# Patient Record
Sex: Female | Born: 2000 | Race: White | Hispanic: No | Marital: Single | Smoking: Never smoker
Health system: Southern US, Community
[De-identification: ages and names within clinical notes are randomized; demographics above are authoritative.]

## PROBLEM LIST (undated history)

## (undated) DIAGNOSIS — Z889 Allergy status to unspecified drugs, medicaments and biological substances status: Secondary | ICD-10-CM

## (undated) DIAGNOSIS — S62109A Fracture of unspecified carpal bone, unspecified wrist, initial encounter for closed fracture: Secondary | ICD-10-CM

## (undated) HISTORY — PX: MOUTH SURGERY: SHX715

---
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
## 2020-05-11 ENCOUNTER — Encounter: Payer: Self-pay | Admitting: *Deleted

## 2020-05-11 ENCOUNTER — Emergency Department (INDEPENDENT_AMBULATORY_CARE_PROVIDER_SITE_OTHER): Payer: No Typology Code available for payment source

## 2020-05-11 ENCOUNTER — Other Ambulatory Visit: Payer: Self-pay

## 2020-05-11 ENCOUNTER — Emergency Department
Admission: EM | Admit: 2020-05-11 | Discharge: 2020-05-11 | Disposition: A | Discharge status: Home / Self Care | Payer: No Typology Code available for payment source | Source: Home / Self Care | Attending: Family Medicine | Admitting: Family Medicine

## 2020-05-11 DIAGNOSIS — S86309A Unspecified injury of muscle(s) and tendon(s) of peroneal muscle group at lower leg level, unspecified leg, initial encounter: Secondary | ICD-10-CM | POA: Diagnosis not present

## 2020-05-11 DIAGNOSIS — M79672 Pain in left foot: Secondary | ICD-10-CM

## 2020-05-11 NOTE — Discharge Instructions (Addendum)
Apply ice pack for 30 minutes 3 to 4 times daily until improved. Wear brace for about 2 to 3 weeks.  Begin range of motion and stretching exercises as tolerated. May take Ibuprofen 200mg , 3 or 4 tabs every 8 hours with food.

## 2020-05-11 NOTE — ED Provider Notes (Signed)
[facility]    CSN: [id number] Arrival date & time: 05/11/20  7829      History   Chief Complaint Chief Complaint  Patient presents with  . Foot Pain    HPI Andrea Valentine is a 19 y.o. female.   Patient developed pain in the lateral aspect of her left foot last night.  She had been outside exercising earlier, but recalls no injury.  She fractured her left ankle several years ago.  The history is provided by the patient.  Foot Pain This is a new problem. The current episode started yesterday. The problem occurs constantly. The problem has not changed since onset.The symptoms are aggravated by walking. Nothing relieves the symptoms. Treatments tried: Tylenol. The treatment provided no relief.    Past Medical History:  Diagnosis Date  . H/O seasonal allergies   . Wrist fracture     Patient Active Problem List   Diagnosis Date Noted  . Elbow fracture, left 05/08/2012  . SHOULDER PAIN, LEFT 05/02/2011  . ELBOW PAIN, LEFT 05/02/2011  . WRIST PAIN, LEFT 05/02/2011  . HAND PAIN, LEFT 05/02/2011  . ALLERGIC RHINITIS 11/22/2010    Past Surgical History:  Procedure Laterality Date  . MOUTH SURGERY      OB History   No obstetric history on file.      Home Medications    Prior to Admission medications   Medication Sig Start Date End Date Taking? Authorizing Provider  FLUoxetine (PROZAC) 10 MG capsule Take by mouth. 04/27/20  Yes [provider]  DiphenhydrAMINE HCl (ALLERGY MED PO) Take by mouth.    [provider]  etonogestrel-ethinyl estradiol (NUVARING) 0.12-0.015 MG/24HR vaginal ring Place 1 each vaginally every 28 (twenty-eight) days. Insert vaginally and leave in place for 3 consecutive weeks, then remove for 1 week.    [provider]    Family History Family History  Problem Relation Age of Onset  . Hypertension Mother   . Hyperlipidemia Father     Social History Social History   Tobacco Use  . Smoking status:  Never Smoker  . Smokeless tobacco: Never Used  Vaping Use  . Vaping Use: Never used  Substance Use Topics  . Alcohol use: No  . Drug use: No     Allergies   Patient has no known allergies.   Review of Systems Review of Systems  Constitutional: Negative.   Musculoskeletal: Negative for joint swelling.  Skin: Negative for color change, rash and wound.  All other systems reviewed and are negative.    Physical Exam Triage Vital Signs ED Triage Vitals  Enc Vitals Group     BP 05/11/20 0949 126/76     Pulse Rate 05/11/20 0949 93     Resp 05/11/20 0949 16     Temp 05/11/20 0949 98.7 F (37.1 C)     Temp Source 05/11/20 0949 Oral     SpO2 05/11/20 0949 98 %     Weight 05/11/20 0946 158 lb (71.7 kg)     Height 05/11/20 0946 5\' 6"  (1.676 m)     Head Circumference --      Peak Flow --      Pain Score 05/11/20 0946 6     Pain Loc --      Pain Edu? --      Excl. in GC? --    No data found.  Updated Vital Signs BP 126/76 (BP Location: Right Arm)   Pulse 93   Temp 98.7 F (  37.1 C) (Oral)   Resp 16   Ht 5\' 6"  (1.676 m)   Wt 71.7 kg   LMP 04/20/2020   SpO2 98%   BMI 25.50 kg/m   Visual Acuity Right Eye Distance:   Left Eye Distance:   Bilateral Distance:    Right Eye Near:   Left Eye Near:    Bilateral Near:     Physical Exam Vitals and nursing note reviewed.  Constitutional:      General: She is not in acute distress. HENT:     Head: Normocephalic.  Eyes:     Pupils: Pupils are equal, round, and reactive to light.  Cardiovascular:     Rate and Rhythm: Normal rate.  Pulmonary:     Effort: Pulmonary effort is normal.  Musculoskeletal:       Feet:     Comments: There is tenderness over the course of the left peroneal tendon and base of 5th metatarsal.  Pain is elicited with resisted eversion and resisted plantar flexion of the ankle.  Distal neurovascular function is intact.    Skin:    General: Skin is warm and dry.  Neurological:     Mental  Status: She is alert.      UC Treatments / Results  Labs (all labs ordered are listed, but only abnormal results are displayed) Labs Reviewed - No data to display  EKG   Radiology DG Foot Complete Left  Result Date: 05/11/2020 CLINICAL DATA:  Pt c/o LT foot pain x 1 day. Denies injury, swelling or bruising. EXAM: LEFT FOOT - COMPLETE 3+ VIEW COMPARISON:  None. FINDINGS: There is no evidence of fracture or dislocation. There is no evidence of arthropathy or other focal bone abnormality. Soft tissues are unremarkable. IMPRESSION: Negative left foot radiographs. Electronically Signed   By: 07/11/2020 M.D.   On: 05/11/2020 11:09    Procedures Procedures (including critical care time)  Medications Ordered in UC Medications - No data to display  Initial Impression / Assessment and Plan / UC Course  I have reviewed the triage vital signs and the nursing notes.  Pertinent labs & imaging results that were available during my care of the patient were reviewed by me and considered in my medical decision making (see chart for details).      Dispensed [employer] stirrup splint. Followup with Dr. 07/11/2020 ([facility]) if not improving about two weeks.   Final Clinical Impressions(s) / UC Diagnoses   Final diagnoses:  Peroneal tendon injury, initial encounter     Discharge Instructions     Apply ice pack for 30 minutes 3 to 4 times daily until improved. Wear brace for about 2 to 3 weeks.  Begin range of motion and stretching exercises as tolerated. May take Ibuprofen 200mg , 3 or 4 tabs every 8 hours with food.      ED Prescriptions    None        Rodney Langton, MD 05/18/20 1745

## 2020-05-11 NOTE — ED Triage Notes (Signed)
Pt c/o LT foot pain x 1 day. Denies injury, swelling or bruising. Took tylenol last night.

## 2021-12-13 ENCOUNTER — Ambulatory Visit: Payer: No Typology Code available for payment source | Admitting: Family Medicine

## 2021-12-25 ENCOUNTER — Encounter: Payer: Self-pay | Admitting: Family Medicine

## 2021-12-25 ENCOUNTER — Ambulatory Visit: Payer: No Typology Code available for payment source | Admitting: Family Medicine

## 2021-12-25 ENCOUNTER — Other Ambulatory Visit: Payer: Self-pay

## 2021-12-25 VITALS — BP 92/55 | HR 77 | Ht 66.0 in | Wt 170.0 lb

## 2021-12-25 DIAGNOSIS — R41840 Attention and concentration deficit: Secondary | ICD-10-CM | POA: Insufficient documentation

## 2021-12-25 DIAGNOSIS — F39 Unspecified mood [affective] disorder: Secondary | ICD-10-CM | POA: Diagnosis not present

## 2021-12-25 MED ORDER — ARIPIPRAZOLE 2 MG PO TABS: 2.0000 mg | ORAL_TABLET | Freq: Every day | ORAL | 0 refills | Status: DC

## 2021-12-25 NOTE — Assessment & Plan Note (Addendum)
PHQ-9 score of 23 and GAD-7 score of 19.  No thoughts of wanting to harm herself.  Also had her complete a mood questionnaire today especially after she noted that she felt "high" the first 2 weeks that she started the Lexapro.  She did have a positive screening on the mood questionnaire, except for rating the symptoms as a minor problem..  She denies any known family history of bipolar manic depressive illness.   ? ?He is open to some changes on her medication regimen since she is already maxed out on the Lexapro regard to add 2 mg of Abilify and I will see her back in 3 to 4 weeks to see if she feels like it is helpful and we can make changes again at that time ?

## 2021-12-25 NOTE — Progress Notes (Signed)
? ?New Patient Office Visit ? ?Subjective:  ?Patient ID: Andrea Valentine, female    DOB: 2001-03-20  Age: 21 y.o. MRN: [id number] ? ?CC:  ?Chief Complaint  ?Patient presents with  ? Establish Care  ? ? ?HPI ?Sharmon Revere presents for establish care.   ? ?He was previously followed at pediatric office but is transferring care here.  She said she started suffering from anxiety symptoms when she was about 21 years old.  She says that it has gotten worse in the last couple years and now she also experiences depression symptoms.  She was initially treated with fluoxetine which did help with the anxiety but again her depression became worse so she was recently switched to Lexapro about 3 months ago and she has been on 28 mg for about 6 weeks.  She does feel like it is actually been very helpful with the depression symptoms and that she is no longer having suicidal thoughts.  But is still really struggling with anxiety and depression in general.  She says she sleeps well but feels like sometimes she sleeps excessively.  She is in school and actually finishes up this semester.  He says when she first started the Lexapro she felt like she was on a "high". ? ?Past Medical History:  ?Diagnosis Date  ? H/O seasonal allergies   ? Wrist fracture   ? ? ?Past Surgical History:  ?Procedure Laterality Date  ? MOUTH SURGERY    ? ? ?Family History  ?Problem Relation Age of Onset  ? Hypertension Mother   ? Hyperlipidemia Father   ? ADD / ADHD Father   ? ? ?Social History  ? ?Socioeconomic History  ? Marital status: Single  ?  Spouse name: Not on file  ? Number of children: Not on file  ? Years of education: Not on file  ? Highest education level: 12th grade  ?Occupational History  ? Occupation: student  ?Tobacco Use  ? Smoking status: Never  ? Smokeless tobacco: Never  ?Vaping Use  ? Vaping Use: Never used  ?Substance and Sexual Activity  ? Alcohol use: Yes  ?  Alcohol/week: 2.0 standard drinks  ?  Types: 2 Standard drinks or  equivalent per week  ? Drug use: No  ? Sexual activity: Yes  ?  Partners: Male  ?  Birth control/protection: Condom, Inserts  ?Other Topics Concern  ? Not on file  ?Social History Narrative  ? Not on file  ? ?Social Determinants of Health  ? ?Financial Resource Strain: Not on file  ?Food Insecurity: Not on file  ?Transportation Needs: Not on file  ?Physical Activity: Not on file  ?Stress: Not on file  ?Social Connections: Not on file  ?Intimate Partner Violence: Not on file  ? ? ?ROS ?Review of Systems  ?Constitutional:  Negative for diaphoresis, fever and unexpected weight change.  ?HENT:  Negative for hearing loss, postnasal drip, sneezing and tinnitus.   ?Eyes:  Negative for visual disturbance.  ?Respiratory:  Negative for cough and wheezing.   ?Cardiovascular:  Negative for chest pain and palpitations.  ?Genitourinary:  Negative for vaginal bleeding and vaginal discharge.  ?Musculoskeletal:  Negative for arthralgias.  ?Neurological:  Negative for headaches.  ?Hematological:  Negative for adenopathy. Does not bruise/bleed easily.  ?Psychiatric/Behavioral:  Positive for dysphoric mood and sleep disturbance. The patient is nervous/anxious.   ? ?Objective:  ? ?Today's Vitals: BP (!) 92/55   Pulse 77   Ht 5\' 6"  (1.676 m)   Wt  170 lb (77.1 kg)   LMP 11/30/2021 (Exact Date)   SpO2 98%   BMI 27.44 kg/m?  ? ?Physical Exam ?Vitals and nursing note reviewed.  ?Constitutional:   ?   Appearance: She is well-developed.  ?HENT:  ?   Head: Normocephalic and atraumatic.  ?Cardiovascular:  ?   Rate and Rhythm: Normal rate and regular rhythm.  ?   Heart sounds: Normal heart sounds.  ?Pulmonary:  ?   Effort: Pulmonary effort is normal.  ?   Breath sounds: Normal breath sounds.  ?Skin: ?   General: Skin is warm and dry.  ?Neurological:  ?   Mental Status: She is alert and oriented to person, place, and time.  ?Psychiatric:     ?   Behavior: Behavior normal.  ? ? ?Assessment & Plan:  ? ?Problem List Items Addressed This Visit    ? ?  ? Other  ? Unspecified mood (affective) disorder (HCC)  ?  PHQ-9 score of 23 and GAD-7 score of 19.  No thoughts of wanting to harm herself.  Also had her complete a mood questionnaire today especially after she noted that she felt "high" the first 2 weeks that she started the Lexapro.  She did have a positive screening on the mood questionnaire, except for rating the symptoms as a minor problem..  She denies any known family history of bipolar manic depressive illness.   ? ?He is open to some changes on her medication regimen since she is already maxed out on the Lexapro regard to add 2 mg of Abilify and I will see her back in 3 to 4 weeks to see if she feels like it is helpful and we can make changes again at that time ?  ?  ? Inattention - Primary  ?  ADHD Screen Symptom check list.  She has 4 positive sxs in Part A and pos 8 in part B.  Can refer for adult ADD testing.   ?  ?  ? Relevant Orders  ? Ambulatory referral to Behavioral Health  ? ? ?Outpatient Encounter Medications as of 12/25/2021  ?Medication Sig  ? ARIPiprazole (ABILIFY) 2 MG tablet Take 1 tablet (2 mg total) by mouth daily.  ? escitalopram (LEXAPRO) 20 MG tablet Take 20 mg by mouth daily.  ? etonogestrel-ethinyl estradiol (NUVARING) 0.12-0.015 MG/24HR vaginal ring Place 1 each vaginally every 28 (twenty-eight) days. Insert vaginally and leave in place for 3 consecutive weeks, then remove for 1 week.  ? fexofenadine (ALLEGRA) 180 MG tablet Take by mouth.  ? [DISCONTINUED] DiphenhydrAMINE HCl (ALLERGY MED PO) Take by mouth.  ? [DISCONTINUED] FLUoxetine (PROZAC) 10 MG capsule Take by mouth.  ? ?No facility-administered encounter medications on file as of 12/25/2021.  ? ? ?Follow-up: Return in about 3 weeks (around 01/15/2022) for New start medication.  ? ?Nani Gasser, MD ? ?

## 2021-12-25 NOTE — Assessment & Plan Note (Addendum)
ADHD Screen Symptom check list.  She has 4 positive sxs in Part A and pos 8 in part B.  Can refer for adult ADD testing.   ?

## 2022-01-17 ENCOUNTER — Other Ambulatory Visit: Payer: Self-pay | Admitting: Family Medicine

## 2022-01-18 ENCOUNTER — Telehealth (INDEPENDENT_AMBULATORY_CARE_PROVIDER_SITE_OTHER): Payer: No Typology Code available for payment source | Admitting: Family Medicine

## 2022-01-18 DIAGNOSIS — F39 Unspecified mood [affective] disorder: Secondary | ICD-10-CM | POA: Diagnosis not present

## 2022-01-18 MED ORDER — ARIPIPRAZOLE 2 MG PO TABS: 2.0000 mg | ORAL_TABLET | Freq: Every day | ORAL | 0 refills | Status: DC

## 2022-01-18 NOTE — Progress Notes (Signed)
Called pt LVM to let her know that I was calling to do her prescreening ? ?Pt reports that she is doing really good on her current regimen.  ? ? ?

## 2022-01-18 NOTE — Assessment & Plan Note (Addendum)
Discussed options.  We will go ahead and place referral to mood treatment center I think it would be helpful to have a formal diagnosis.  This I truly suspect she has bipolar disorder I am concerned about the increase in shopping.  So we discussed decreasing her Lexapro to 10 mg and sticking with the 2 mg Abilify and then following up in 4 to 6 weeks unless she is able to get in with the mood treatment center sooner.  She just filled a 90-day supply on the Lexapro so just encouraged her to split the tabs in half. ?

## 2022-01-18 NOTE — Progress Notes (Signed)
? ? ?Virtual Visit via Video Note ? ?I connected with Andrea Valentine on 01/18/22 at  1:00 PM EDT by a video enabled telemedicine application and verified that I am speaking with the correct person using two identifiers. ?  ?I discussed the limitations of evaluation and management by telemedicine and the availability of in person appointments. The patient expressed understanding and agreed to proceed. ? ?Patient location: at home ?Provider location: in office ? ?Subjective:   ? ?CC:  No chief complaint on file. ? ? ?HPI: ?She feels like she is enjoying things. Not sleeping excessively. Shopping impulsiveness is there.  She says she is not sure if she is shopping because she is happy or if it is bc of the mood.  She feels like her depressive symptoms are significantly better though.  She went from a PHQ-9 score of 23 down to 2.  GAD-7 score went down as well but not as significantly.  She just feels like things are in a much better place.  She has not had any side effects with the Abilify thus far.  He is interested in a consultation at the mood treatment center in [place]. ? ? ?  01/18/2022  ? 12:57 PM 12/25/2021  ? 10:13 AM  ?PHQ9 SCORE ONLY  ?PHQ-9 Total Score 2 23  ? ? ? ? ?Past medical history, Surgical history, Family history not pertinant except as noted below, Social history, Allergies, and medications have been entered into the medical record, reviewed, and corrections made.  ? ? ?Objective:   ? ?General: Speaking clearly in complete sentences without any shortness of breath.  Alert and oriented x3.  Normal judgment. No apparent acute distress. ? ? ? ?Impression and Recommendations:   ? ?Problem List Items Addressed This Visit   ? ?  ? Other  ? Unspecified mood (affective) disorder (HCC) - Primary  ?  Discussed options.  We will go ahead and place referral to mood treatment center I think it would be helpful to have a formal diagnosis.  This I truly suspect she has bipolar disorder I am concerned about the  increase in shopping.  So we discussed decreasing her Lexapro to 10 mg and sticking with the 2 mg Abilify and then following up in 4 to 6 weeks unless she is able to get in with the mood treatment center sooner.  She just filled a 90-day supply on the Lexapro so just encouraged her to split the tabs in half. ? ?  ?  ? Relevant Orders  ? Ambulatory referral to Behavioral Health  ? ? ?Orders Placed This Encounter  ?Procedures  ? Ambulatory referral to Behavioral Health  ?  Referral Priority:   Routine  ?  Referral Type:   Psychiatric  ?  Referral Reason:   Specialty Services Required  ?  Requested Specialty:   Behavioral Health  ?  Number of Visits Requested:   1  ? ? ?Meds ordered this encounter  ?Medications  ? ARIPiprazole (ABILIFY) 2 MG tablet  ?  Sig: Take 1 tablet (2 mg total) by mouth daily.  ?  Dispense:  90 tablet  ?  Refill:  0  ? ? ? ?I discussed the assessment and treatment plan with the patient. The patient was provided an opportunity to ask questions and all were answered. The patient agreed with the plan and demonstrated an understanding of the instructions. ?  ?The patient was advised to call back or seek an in-person evaluation if the symptoms  worsen or if the condition fails to improve as anticipated. ? ? ?Nani Gasser, MD  ? ?

## 2022-01-21 ENCOUNTER — Encounter: Payer: Self-pay | Admitting: Family Medicine

## 2022-01-22 NOTE — Telephone Encounter (Signed)
Can always write a generic note saying please excuse her absences over the current semester as we were adjusting her psychiatric medications.  But as far as specific dates were not technically able to cover her for that unless she had an appointment where she mentioned those missed dates or even a telephone note.  But we can just say in general that we were adjusting her medication and that she may have missed some dates. ?

## 2022-01-23 ENCOUNTER — Encounter: Payer: Self-pay | Admitting: Family Medicine

## 2022-03-06 ENCOUNTER — Emergency Department (INDEPENDENT_AMBULATORY_CARE_PROVIDER_SITE_OTHER): Payer: No Typology Code available for payment source

## 2022-03-06 ENCOUNTER — Emergency Department
Admission: RE | Admit: 2022-03-06 | Discharge: 2022-03-06 | Disposition: A | Discharge status: Home / Self Care | Payer: No Typology Code available for payment source | Source: Ambulatory Visit

## 2022-03-06 VITALS — BP 103/74 | HR 80 | Temp 98.0°F | Resp 16

## 2022-03-06 DIAGNOSIS — M25572 Pain in left ankle and joints of left foot: Secondary | ICD-10-CM

## 2022-03-06 DIAGNOSIS — S93402A Sprain of unspecified ligament of left ankle, initial encounter: Secondary | ICD-10-CM

## 2022-03-06 DIAGNOSIS — M79672 Pain in left foot: Secondary | ICD-10-CM | POA: Diagnosis not present

## 2022-03-06 NOTE — ED Triage Notes (Signed)
Pt states she tripped on uneven sidewalk 10 days ago and rolled both ankles. States they have been painful and bruised but the left ankle is not feeling better.

## 2022-03-06 NOTE — Discharge Instructions (Addendum)
Your xray is negative for an acute fracture. You have an ankle sprain, which is a stretching or tear of the ligaments in your ankle. Please start doing the exercises attached. Continue with ibuprofen to help with swelling, you can take 600mg  every 6-8 hours as needed. Take with food. Please wear the air cast daily to prevent additional injuries. Wear this a minimum of two weeks, preferably three. You can follow up with sports medicine, PT or acupuncture if symptoms are not improved or nearly resolved in the next 2 weeks.

## 2022-03-06 NOTE — ED Provider Notes (Signed)
[facility]    CSN: [id number] Arrival date & time: 03/06/22  1048      History   Chief Complaint Chief Complaint  Patient presents with   Foot Injury    Rolled both my ankles last Sunday and they still hurt pretty bad to walk on and are bruised - Entered by patient   Ankle Pain    HPI Andrea Valentine is a 21 y.o. female.   Pleasant 21 yo female presents with concern of L ankle pain. She states 10 days ago, she was wearing a new pair of platform tennis shoes, and tripped over uneven concrete. She states she rolled both of her ankles at the time, but that the R one is feeling better. The L ankle is still swollen, bruised and tender. She has been applying ice and taking OTC ibuprofen. She admits to hx of tendon issue on the L ankle recently, and hx of 13 broken bones in the past. The area of maximal tenderness at present time is the lateral talus and lateral malleolus. She is able to fully weight bear but reports some discomfort. No additional concerns today.   Foot Injury Ankle Pain  Past Medical History:  Diagnosis Date   H/O seasonal allergies    Wrist fracture     Patient Active Problem List   Diagnosis Date Noted   Unspecified mood (affective) disorder (HCC) 12/25/2021   Inattention 12/25/2021   Palpitations 10/16/2018   Near syncope 10/16/2018   Elbow fracture, left 05/08/2012   SHOULDER PAIN, LEFT 05/02/2011   ELBOW PAIN, LEFT 05/02/2011   WRIST PAIN, LEFT 05/02/2011   HAND PAIN, LEFT 05/02/2011   ALLERGIC RHINITIS 11/22/2010    Past Surgical History:  Procedure Laterality Date   MOUTH SURGERY      OB History   No obstetric history on file.      Home Medications    Prior to Admission medications   Medication Sig Start Date End Date Taking? Authorizing Provider  ARIPiprazole (ABILIFY) 2 MG tablet Take 1 tablet (2 mg total) by mouth daily. 01/18/22   Agapito Games, MD  escitalopram (LEXAPRO) 20 MG tablet Take 20 mg by mouth daily.  12/05/21   Gweneth Fritter, NP  etonogestrel-ethinyl estradiol (NUVARING) 0.12-0.015 MG/24HR vaginal ring Place 1 each vaginally every 28 (twenty-eight) days. Insert vaginally and leave in place for 3 consecutive weeks, then remove for 1 week.    [provider]  fexofenadine (ALLEGRA) 180 MG tablet Take by mouth.    [provider]    Family History Family History  Problem Relation Age of Onset   Hypertension Mother    Hyperlipidemia Father    ADD / ADHD Father     Social History Social History   Tobacco Use   Smoking status: Never   Smokeless tobacco: Never  Vaping Use   Vaping Use: Never used  Substance Use Topics   Alcohol use: Yes    Alcohol/week: 2.0 standard drinks    Types: 2 Standard drinks or equivalent per week   Drug use: No     Allergies   Patient has no known allergies.   Review of Systems Review of Systems  Musculoskeletal:  Positive for arthralgias and joint swelling (lateral L ankle).    Physical Exam Triage Vital Signs ED Triage Vitals  Enc Vitals Group     BP 03/06/22 1103 103/74     Pulse Rate 03/06/22 1103 80     Resp 03/06/22 1103 16  Temp 03/06/22 1103 98 F (36.7 C)     Temp Source 03/06/22 1103 Oral     SpO2 03/06/22 1103 99 %     Weight --      Height --      Head Circumference --      Peak Flow --      Pain Score 03/06/22 1105 5     Pain Loc --      Pain Edu? --      Excl. in GC? --    No data found.  Updated Vital Signs BP 103/74 (BP Location: Right Arm)   Pulse 80   Temp 98 F (36.7 C) (Oral)   Resp 16   LMP 03/02/2022 (Exact Date)   SpO2 99%   Visual Acuity Right Eye Distance:   Left Eye Distance:   Bilateral Distance:    Right Eye Near:   Left Eye Near:    Bilateral Near:     Physical Exam Vitals and nursing note reviewed.  Constitutional:      General: She is not in acute distress.    Appearance: Normal appearance. She is normal weight. She is not ill-appearing, toxic-appearing or  diaphoretic.  HENT:     Head: Normocephalic and atraumatic.  Musculoskeletal:        General: Swelling, tenderness (lateral talus, lateral malleolus) and signs of injury present. Normal range of motion.     Comments: Negative homan sign Thompson test negative Talar tilt negative, however painful with manipulation  Skin:    General: Skin is warm and dry.     Findings: Bruising (extensive ecchymosis to lateral ankle extending from lateral talus, distal fibula, to nearly the 3-5th MTPs. Large pooling noted to plantar surface near heel) present. No erythema, lesion or rash.  Neurological:     Mental Status: She is alert.     UC Treatments / Results  Labs (all labs ordered are listed, but only abnormal results are displayed) Labs Reviewed - No data to display  EKG   Radiology DG Ankle Complete Left  Result Date: 03/06/2022 CLINICAL DATA:  Left ankle injury EXAM: LEFT ANKLE COMPLETE - 3+ VIEW COMPARISON:  None Available. FINDINGS: There is no evidence of fracture, dislocation, or joint effusion. There is no evidence of arthropathy or other focal bone abnormality. Mild soft tissue swelling laterally. IMPRESSION: No acute osseous abnormality identified. Electronically Signed   By: Jannifer Hick M.D.   On: 03/06/2022 12:08   DG Foot Complete Left  Result Date: 03/06/2022 CLINICAL DATA:  Left foot injury EXAM: LEFT FOOT - COMPLETE 3+ VIEW COMPARISON:  None Available. FINDINGS: There is no evidence of fracture or dislocation. There is no evidence of arthropathy or other focal bone abnormality. Soft tissues are unremarkable. IMPRESSION: Negative. Electronically Signed   By: Jannifer Hick M.D.   On: 03/06/2022 12:09    Procedures Procedures (including critical care time)  Medications Ordered in UC Medications - No data to display  Initial Impression / Assessment and Plan / UC Course  I have reviewed the triage vital signs and the nursing notes.  Pertinent labs & imaging results  that were available during my care of the patient were reviewed by me and considered in my medical decision making (see chart for details).     L ankle sprain - possible complete tear. Pt denies need for advanced imaging such as MRI. Air cast provided for stabilization. Ibuprofen TID to QID recommended. Follow up with sports med, PT or acupuncture should  sx exceed an additional 2 weeks.   Final Clinical Impressions(s) / UC Diagnoses   Final diagnoses:  Sprain of left ankle, unspecified ligament, initial encounter     Discharge Instructions      Your xray is negative for an acute fracture. You have an ankle sprain, which is a stretching or tear of the ligaments in your ankle. Please start doing the exercises attached. Continue with ibuprofen to help with swelling, you can take 600mg  every 6-8 hours as needed. Take with food. Please wear the air cast daily to prevent additional injuries. Wear this a minimum of two weeks, preferably three. You can follow up with sports medicine, PT or acupuncture if symptoms are not improved or nearly resolved in the next 2 weeks.      ED Prescriptions   None    PDMP not reviewed this encounter.   Maretta BeesCrain, [facility] Domangue L, [place] 03/06/22 1230

## 2022-03-10 ENCOUNTER — Other Ambulatory Visit: Payer: Self-pay | Admitting: Family Medicine

## 2022-03-11 ENCOUNTER — Other Ambulatory Visit: Payer: Self-pay | Admitting: Family Medicine

## 2022-03-18 ENCOUNTER — Encounter: Payer: Self-pay | Admitting: Family Medicine

## 2022-05-12 ENCOUNTER — Other Ambulatory Visit: Payer: Self-pay | Admitting: Family Medicine

## 2022-05-14 MED ORDER — ARIPIPRAZOLE 2 MG PO TABS: 2.0000 mg | ORAL_TABLET | Freq: Every day | ORAL | 0 refills | Status: DC

## 2022-05-16 ENCOUNTER — Other Ambulatory Visit: Payer: Self-pay | Admitting: Family Medicine

## 2022-05-16 ENCOUNTER — Encounter: Payer: Self-pay | Admitting: Family Medicine

## 2022-05-16 MED ORDER — ARIPIPRAZOLE 2 MG PO TABS: 2.0000 mg | ORAL_TABLET | Freq: Every day | ORAL | 0 refills | Status: DC

## 2022-05-18 ENCOUNTER — Other Ambulatory Visit: Payer: Self-pay | Admitting: Family Medicine

## 2022-05-22 MED ORDER — ARIPIPRAZOLE 2 MG PO TABS: 2.0000 mg | ORAL_TABLET | Freq: Every day | ORAL | 0 refills | Status: DC

## 2022-05-22 NOTE — Telephone Encounter (Signed)
Occasion refilled.  Please have patient schedule follow-up in October mood.  Meds ordered this encounter  Medications   DISCONTD: ARIPiprazole (ABILIFY) 2 MG tablet    Sig: Take 1 tablet (2 mg total) by mouth daily.    Dispense:  30 tablet    Refill:  0   ARIPiprazole (ABILIFY) 2 MG tablet    Sig: Take 1 tablet (2 mg total) by mouth daily.    Dispense:  90 tablet    Refill:  0

## 2022-07-13 ENCOUNTER — Encounter: Payer: Self-pay | Admitting: Family Medicine

## 2022-07-13 ENCOUNTER — Telehealth (INDEPENDENT_AMBULATORY_CARE_PROVIDER_SITE_OTHER): Payer: No Typology Code available for payment source | Admitting: Family Medicine

## 2022-07-13 VITALS — Ht 66.0 in | Wt 185.0 lb

## 2022-07-13 DIAGNOSIS — F3130 Bipolar disorder, current episode depressed, mild or moderate severity, unspecified: Secondary | ICD-10-CM | POA: Diagnosis not present

## 2022-07-13 DIAGNOSIS — G43809 Other migraine, not intractable, without status migrainosus: Secondary | ICD-10-CM | POA: Diagnosis not present

## 2022-07-13 MED ORDER — ESCITALOPRAM OXALATE 5 MG PO TABS: 5.0000 mg | ORAL_TABLET | Freq: Every day | ORAL | 0 refills | Status: DC

## 2022-07-13 MED ORDER — SERTRALINE HCL 50 MG PO TABS: ORAL_TABLET | ORAL | 0 refills | Status: DC

## 2022-07-13 NOTE — Progress Notes (Unsigned)
Virtual Visit via Video Note  I connected with Andrea Valentine on 07/14/22 at  1:00 PM EDT by a video enabled telemedicine application and verified that I am speaking with the correct person using two identifiers.   I discussed the limitations of evaluation and management by telemedicine and the availability of in person appointments. The patient expressed understanding and agreed to proceed.  Patient location: at home Provider location: in office  Subjective:    CC:   Chief Complaint  Patient presents with   Anxiety    HPI: Follow-up on bipolar disorder.  We had actually placed a referral to the mood treatment center in April but she says she never heard back from them.  Now on Abilify with Lexapro.  Was on Prozac.  Dx with Bipolar Depression. Sleeping too well.  No regular exercise. She is really struggling with anxiety.  When on prozac felt her anxiety was better controlled but doesn't want to go back on prozac.   Occ gets migraines. In bed with a HA today.  She does get occasional headaches but was not sure if maybe she is coming down with something.   Past medical history, Surgical history, Family history not pertinant except as noted below, Social history, Allergies, and medications have been entered into the medical record, reviewed, and corrections made.    Objective:    General: Speaking clearly in complete sentences without any shortness of breath.  Alert and oriented x3.  Normal judgment. No apparent acute distress.    Impression and Recommendations:    Problem List Items Addressed This Visit       Cardiovascular and Mediastinum   Migraine    Discussed that she mostly relies on OTCs but we can consider additional treatments like tryptans or prophylaxis if needed.        Relevant Medications   escitalopram (LEXAPRO) 5 MG tablet   sertraline (ZOLOFT) 50 MG tablet     Other   Bipolar affective disorder, current episode depressed ([place]) - Primary    She was  formally diagnosed with Bipolar disorder. Feels her depression is well controlled but really struggling with her anxiety. Discussed options of changing back to prozac, or switching to sertraline.  She has already tried 20mg  of Lexapro and didn't feel well on it so went back down to 10mg .  Will taper off the lexapro and switch to sertraline.       Relevant Medications   escitalopram (LEXAPRO) 5 MG tablet   sertraline (ZOLOFT) 50 MG tablet    Headaches - did discuss that is she starts having any URI sxs recommend testing for COVID tomorrow.   No orders of the defined types were placed in this encounter.   Meds ordered this encounter  Medications   escitalopram (LEXAPRO) 5 MG tablet    Sig: Take 1 tablet (5 mg total) by mouth daily.    Dispense:  15 tablet    Refill:  0   sertraline (ZOLOFT) 50 MG tablet    Sig: Take 0.5 tablets (25 mg total) by mouth daily for 8 days, THEN 1 tablet (50 mg total) daily for 22 days.    Dispense:  30 tablet    Refill:  0     I discussed the assessment and treatment plan with the patient. The patient was provided an opportunity to ask questions and all were answered. The patient agreed with the plan and demonstrated an understanding of the instructions.   The patient was advised  to call back or seek an in-person evaluation if the symptoms worsen or if the condition fails to improve as anticipated.   Beatrice Lecher, MD

## 2022-07-13 NOTE — Progress Notes (Signed)
Patient states Lexapro has caused her anxiety to be worse. She states it did help with depression. She has been cutting the Lexapro 20mg  tablet in half and is only taking 10mg  . ( She states she previously stopped Prozac due to s/e of "bad thoughts"

## 2022-07-14 DIAGNOSIS — G43909 Migraine, unspecified, not intractable, without status migrainosus: Secondary | ICD-10-CM | POA: Insufficient documentation

## 2022-07-14 DIAGNOSIS — F3130 Bipolar disorder, current episode depressed, mild or moderate severity, unspecified: Secondary | ICD-10-CM | POA: Insufficient documentation

## 2022-07-14 NOTE — Assessment & Plan Note (Signed)
She was formally diagnosed with Bipolar disorder. Feels her depression is well controlled but really struggling with her anxiety. Discussed options of changing back to prozac, or switching to sertraline.  She has already tried 20mg  of Lexapro and didn't feel well on it so went back down to 10mg .  Will taper off the lexapro and switch to sertraline.

## 2022-07-14 NOTE — Assessment & Plan Note (Signed)
Discussed that she mostly relies on OTCs but we can consider additional treatments like tryptans or prophylaxis if needed.

## 2022-07-22 ENCOUNTER — Other Ambulatory Visit: Payer: Self-pay | Admitting: Family Medicine

## 2022-07-22 DIAGNOSIS — F3130 Bipolar disorder, current episode depressed, mild or moderate severity, unspecified: Secondary | ICD-10-CM

## 2022-07-31 ENCOUNTER — Other Ambulatory Visit: Payer: Self-pay | Admitting: Family Medicine

## 2022-07-31 DIAGNOSIS — F3130 Bipolar disorder, current episode depressed, mild or moderate severity, unspecified: Secondary | ICD-10-CM

## 2022-08-06 ENCOUNTER — Other Ambulatory Visit: Payer: Self-pay | Admitting: Family Medicine

## 2022-08-06 DIAGNOSIS — F3130 Bipolar disorder, current episode depressed, mild or moderate severity, unspecified: Secondary | ICD-10-CM

## 2022-08-18 ENCOUNTER — Other Ambulatory Visit: Payer: Self-pay | Admitting: Family Medicine

## 2022-08-21 ENCOUNTER — Other Ambulatory Visit: Payer: Self-pay | Admitting: Family Medicine

## 2022-08-21 DIAGNOSIS — F3130 Bipolar disorder, current episode depressed, mild or moderate severity, unspecified: Secondary | ICD-10-CM

## 2022-09-27 ENCOUNTER — Telehealth: Payer: Self-pay | Admitting: General Practice

## 2022-09-27 NOTE — Telephone Encounter (Signed)
Transition Care Management Unsuccessful Follow-up Telephone Call  Date of discharge and from where:  09/26/22 from [facility]  Attempts:  1st Attempt  Reason for unsuccessful TCM follow-up call:  Left voice message    

## 2022-09-28 ENCOUNTER — Ambulatory Visit (INDEPENDENT_AMBULATORY_CARE_PROVIDER_SITE_OTHER): Payer: No Typology Code available for payment source

## 2022-09-28 ENCOUNTER — Ambulatory Visit: Payer: No Typology Code available for payment source | Admitting: Medical-Surgical

## 2022-09-28 ENCOUNTER — Encounter: Payer: Self-pay | Admitting: Medical-Surgical

## 2022-09-28 VITALS — BP 91/58 | HR 74 | Resp 20 | Ht 66.0 in | Wt 173.8 lb

## 2022-09-28 DIAGNOSIS — M546 Pain in thoracic spine: Secondary | ICD-10-CM | POA: Diagnosis not present

## 2022-09-28 DIAGNOSIS — Z23 Encounter for immunization: Secondary | ICD-10-CM | POA: Diagnosis not present

## 2022-09-28 MED ORDER — CYCLOBENZAPRINE HCL 10 MG PO TABS: 5.0000 mg | ORAL_TABLET | Freq: Three times a day (TID) | ORAL | 1 refills | Status: AC | PRN

## 2022-09-28 MED ORDER — PREDNISONE 5 MG (21) PO TBPK: ORAL_TABLET | ORAL | 0 refills | Status: AC

## 2022-09-28 NOTE — Progress Notes (Signed)
Established Patient Office Visit  Subjective   Patient ID: Andrea Valentine, female   DOB: November 14, 2000 Age: 21 y.o. MRN: [id number]   Chief Complaint  Patient presents with   Back Pain   HPI Pleasant 21 year old female presenting today for evaluation of mid back pain for the last 2 to 3 days.  She was seen in the ED on 12/27 after beginning to experience midline thoracic back pain that spread bilaterally.  Notes that she and friends went out on 12/26 to several bars and were drinking but she was not drunk.  Feels that she was still cognizant.  They were running from 1 bar to another and she thinks that she may have slipped in the rain but denies any falls.  The next morning, she woke with the mid back pain that gradually progressed through the night until she had difficulty moving her upper extremities and neck.  She went to the ED but after several hours there, she left without being seen.  Over the past couple of days has gotten somewhat better.  She has taken a couple of doses of ibuprofen 800 mg and is not sure if they have helped much at all.   Objective:    Vitals:   09/28/22 1318  BP: (!) 91/58  Pulse: 74  Resp: 20  Height: 5\' 6"  (1.676 m)  Weight: 173 lb 12.8 oz (78.8 kg)  SpO2: 98%  BMI (Calculated): 28.07    Physical Exam Vitals and nursing note reviewed.  Constitutional:      General: She is not in acute distress.    Appearance: Normal appearance. She is not ill-appearing.  HENT:     Head: Normocephalic and atraumatic.  Cardiovascular:     Rate and Rhythm: Normal rate and regular rhythm.     Pulses: Normal pulses.     Heart sounds: Normal heart sounds.  Pulmonary:     Effort: Pulmonary effort is normal. No respiratory distress.     Breath sounds: Normal breath sounds. No wheezing, rhonchi or rales.  Skin:    General: Skin is warm and dry.  Neurological:     Mental Status: She is alert and oriented to person, place, and time.  Psychiatric:        Mood and Affect:  Mood normal.        Behavior: Behavior normal.        Thought Content: Thought content normal.        Judgment: Judgment normal.   No results found for this or any previous visit (from the past 24 hour(s)).     The ASCVD Risk score (Arnett DK, et al., 2019) failed to calculate for the following reasons:   The 2019 ASCVD risk score is only valid for ages 45 to 76   Assessment & Plan:   1. Need for influenza vaccination Flu vaccine given in office today. - Flu Vaccine QUAD 6+ mos PF IM (Fluarix Quad PF)  2. Need for COVID-19 vaccine COVID booster given in office today. 76 Fall 2023 Covid-19 Vaccine 75yrs and older  3. Acute midline thoracic back pain Suspicious for thoracic strain although method of injury is unclear.  Getting thoracic spine x-rays today since tenderness over the midline.  Start steroid taper x 6 days.  Adding Flexeril 5-10 mg 3 times daily as needed but advised that this will make her drowsy and she should use predominantly at night.  After completing the prednisone burst, okay to use ibuprofen 6-800 mg every  8 hours. - DG Thoracic Spine W/Swimmers; Future  Return if symptoms worsen or fail to improve.  ___________________________________________ Thayer Ohm, DNP, APRN, FNP-BC Primary Care and Sports Medicine [facility] [place]

## 2022-10-02 NOTE — Telephone Encounter (Signed)
Transition Care Management Unsuccessful Follow-up Telephone Call  Date of discharge and from where:  09/26/22 from [facility]  Attempts:  2nd Attempt  Reason for unsuccessful TCM follow-up call:  No answer/busy

## 2022-10-05 NOTE — Telephone Encounter (Signed)
Transition Care Management Unsuccessful Follow-up Telephone Call  Date of discharge and from where:  09/26/22 from [facility]  Attempts:  3rd Attempt  Reason for unsuccessful TCM follow-up call:  No answer/busy    

## 2022-11-12 ENCOUNTER — Encounter: Payer: Self-pay | Admitting: Family Medicine

## 2022-11-12 NOTE — Telephone Encounter (Signed)
If she could send [place] a picture of her positive test that would be helpful for just documentation purposes but yes we can provide a work note for those dates.

## 2022-11-13 ENCOUNTER — Encounter: Payer: Self-pay | Admitting: Family Medicine

## 2023-06-06 IMAGING — DX DG FOOT COMPLETE 3+V*L*
3 series · 3 of 3 positions shown · non-contrast
Comparison: None Available.

CLINICAL DATA: Left foot injury

EXAM:
LEFT FOOT - COMPLETE 3+ VIEW

[foot ap]
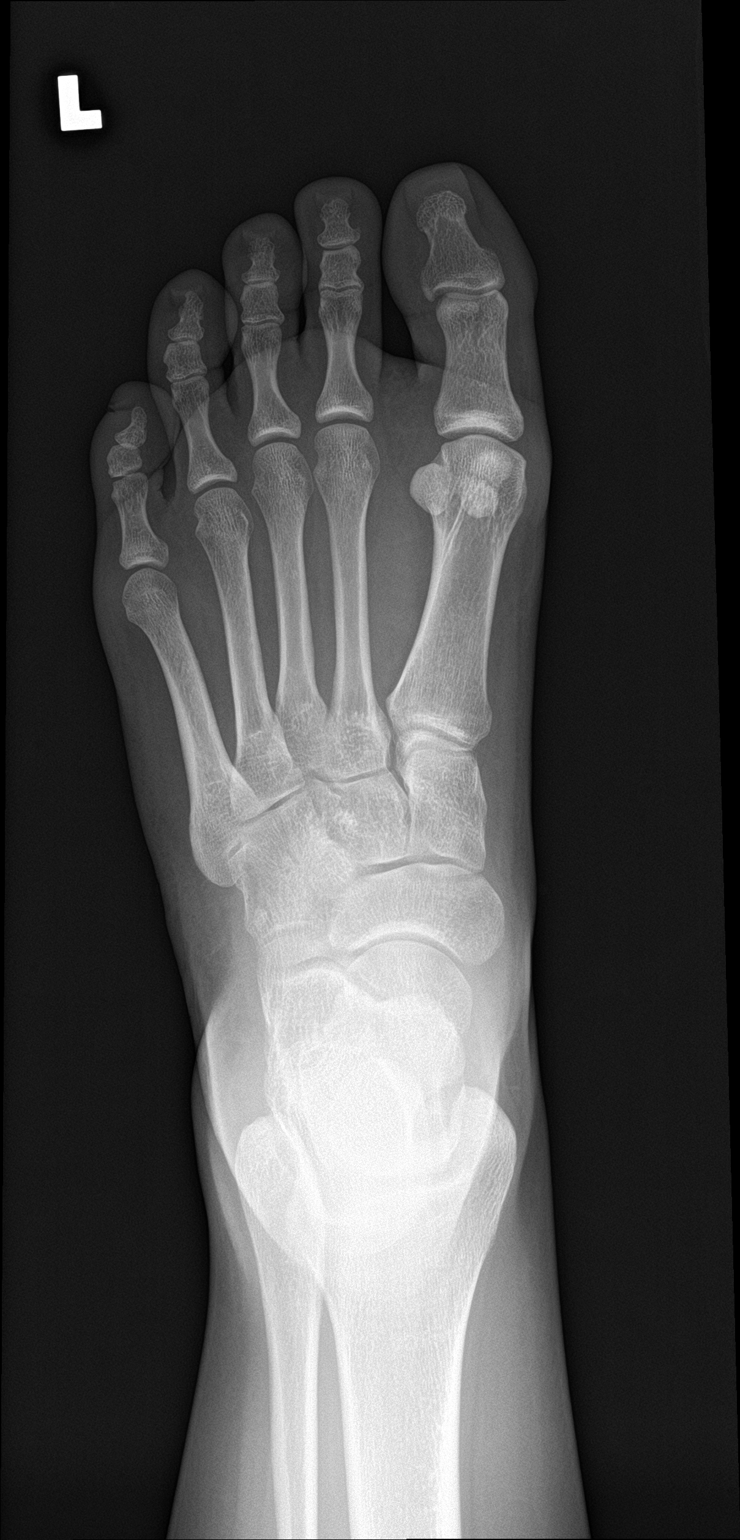

[foot obl]
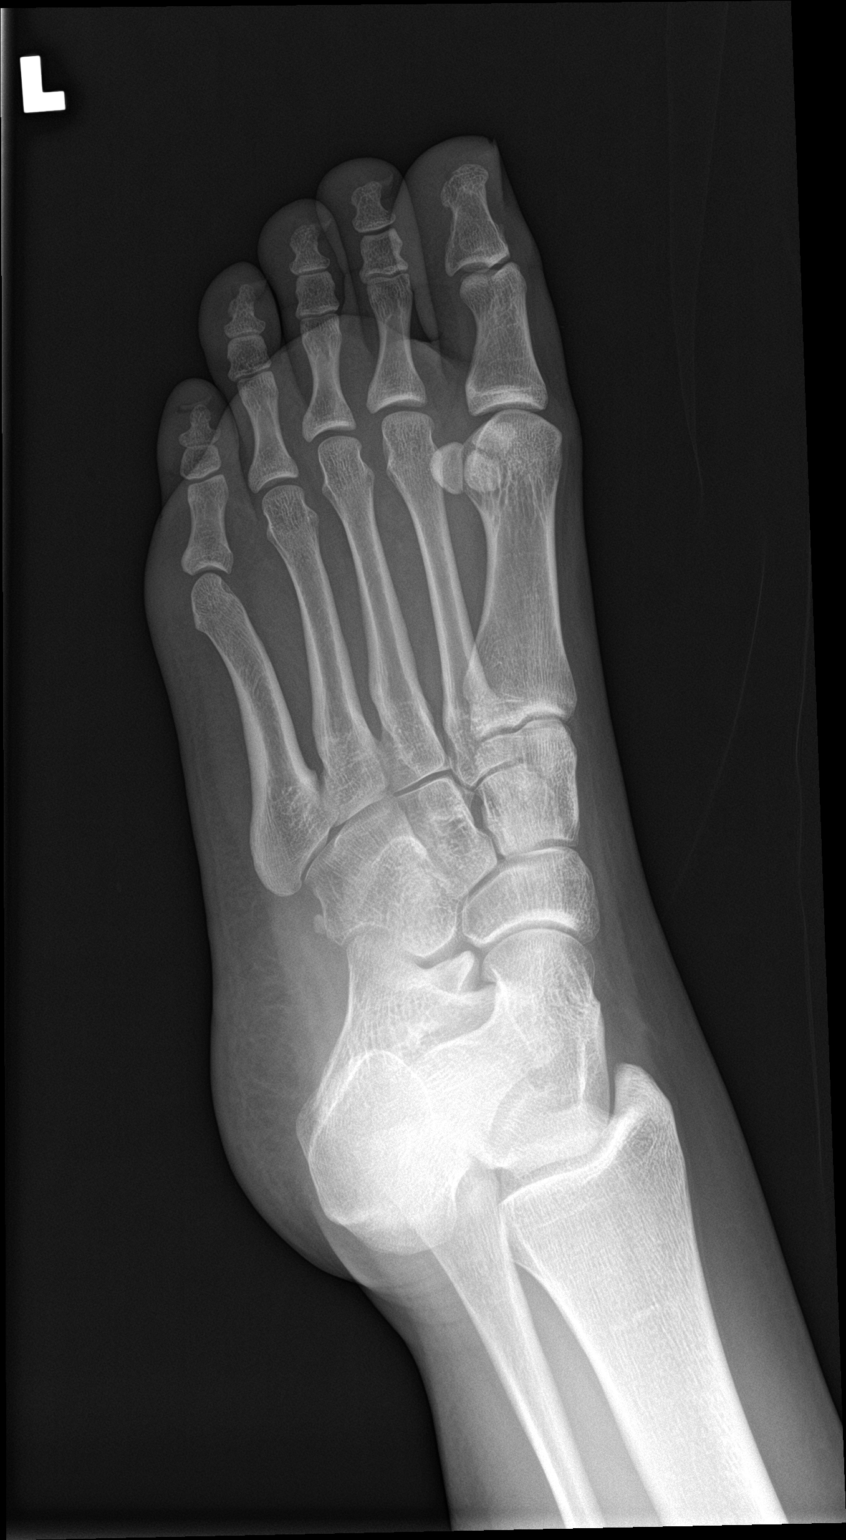

[foot lat]
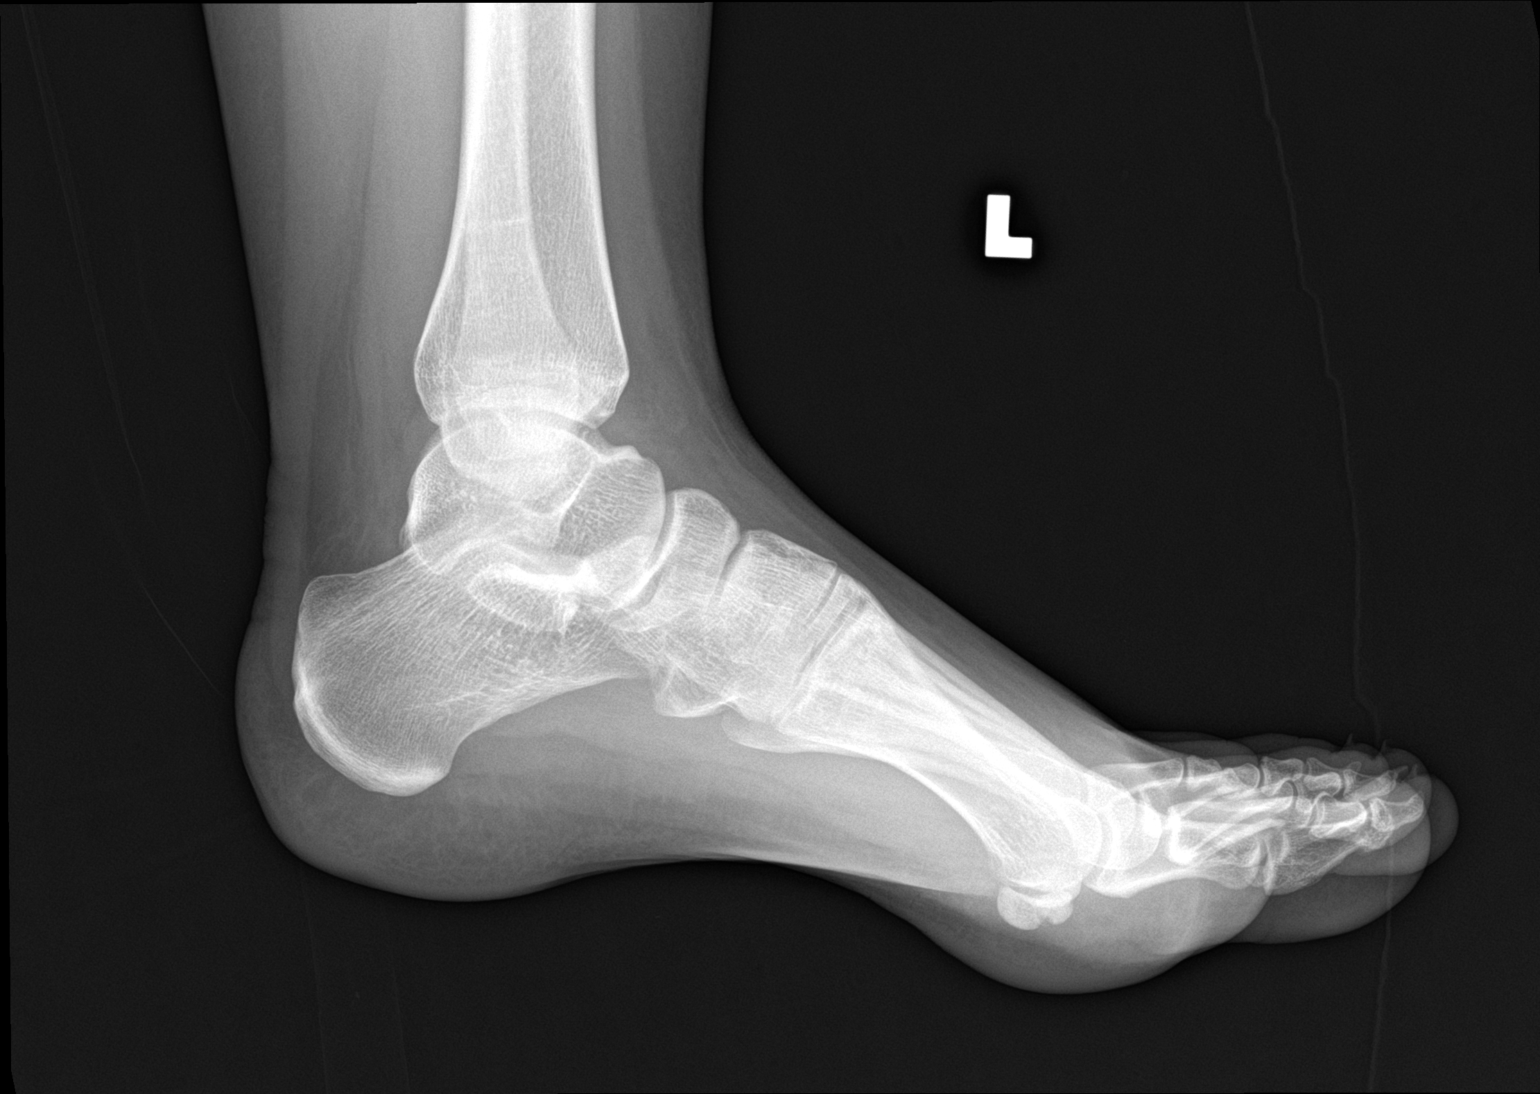

[3 of 3 positions shown; findings below may reference images not displayed]

FINDINGS: There is no evidence of fracture or dislocation. There is no
evidence of arthropathy or other focal bone abnormality. Soft
tissues are unremarkable.
IMPRESSION: Negative.

## 2023-06-06 IMAGING — DX DG ANKLE COMPLETE 3+V*L*
3 series · 3 of 3 positions shown · non-contrast
Comparison: None Available.

CLINICAL DATA: Left ankle injury

EXAM:
LEFT ANKLE COMPLETE - 3+ VIEW

[ankle ap]
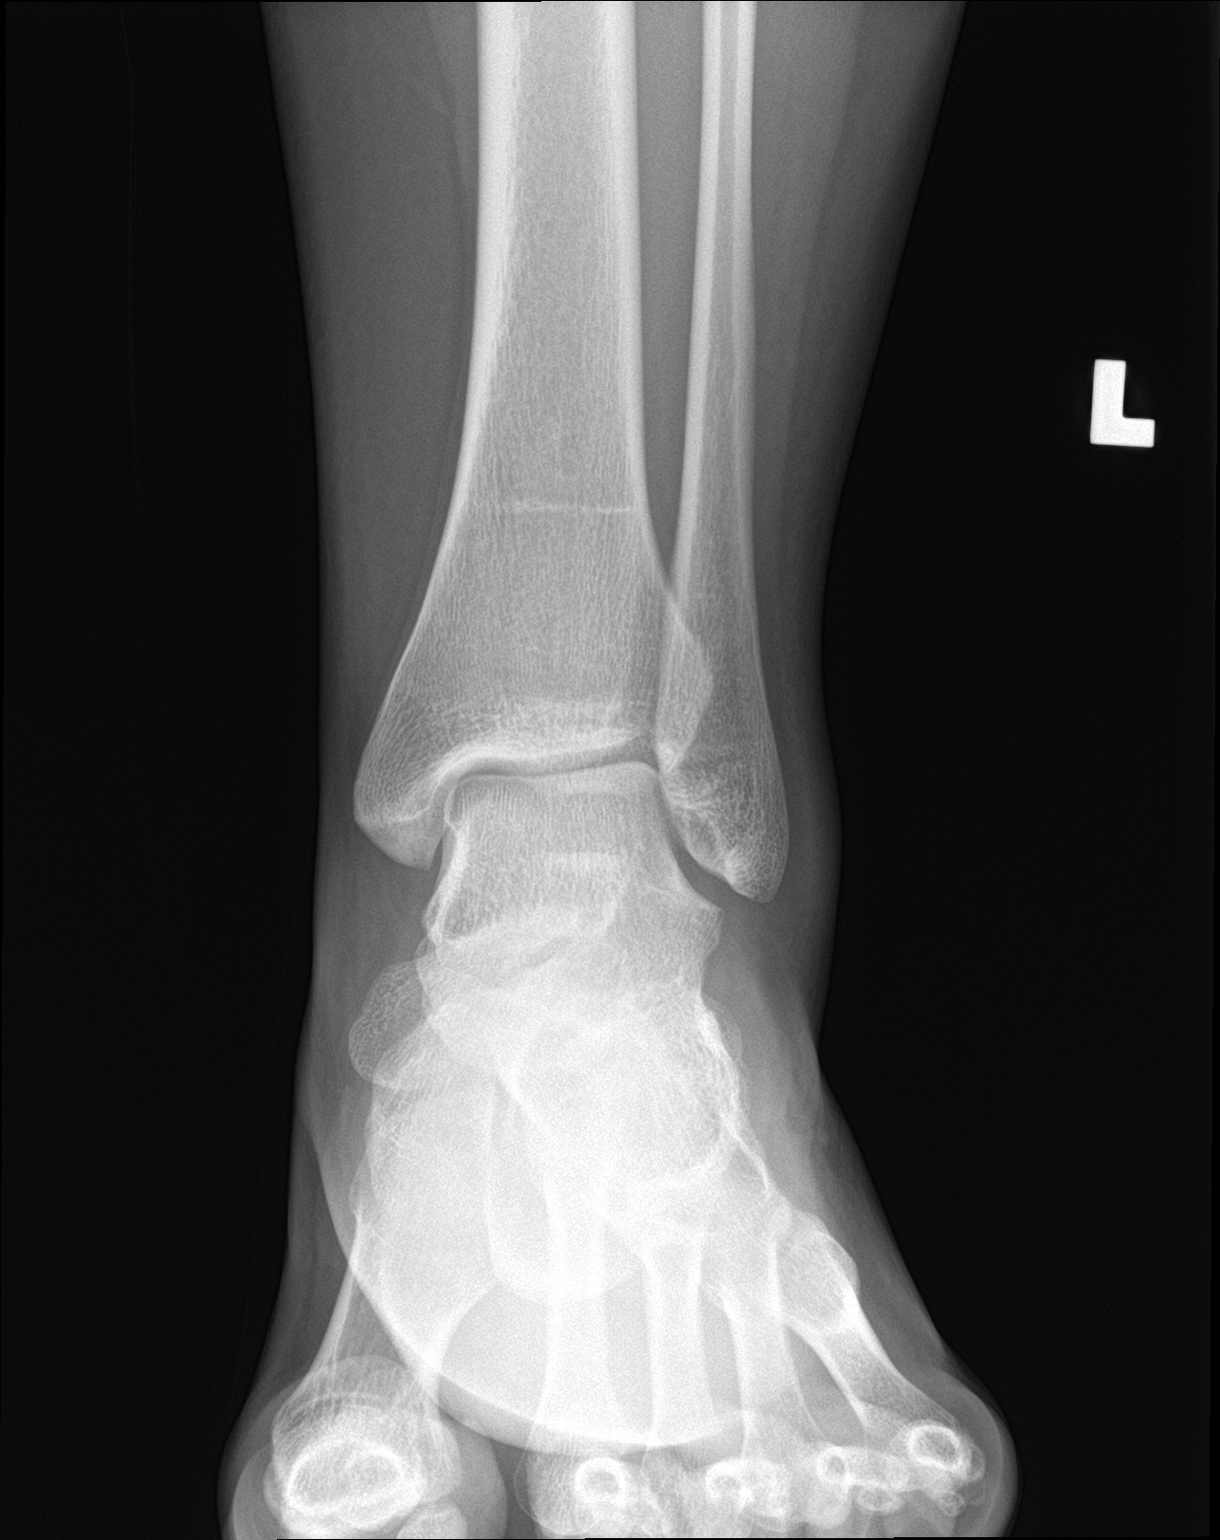

[ankle obl]
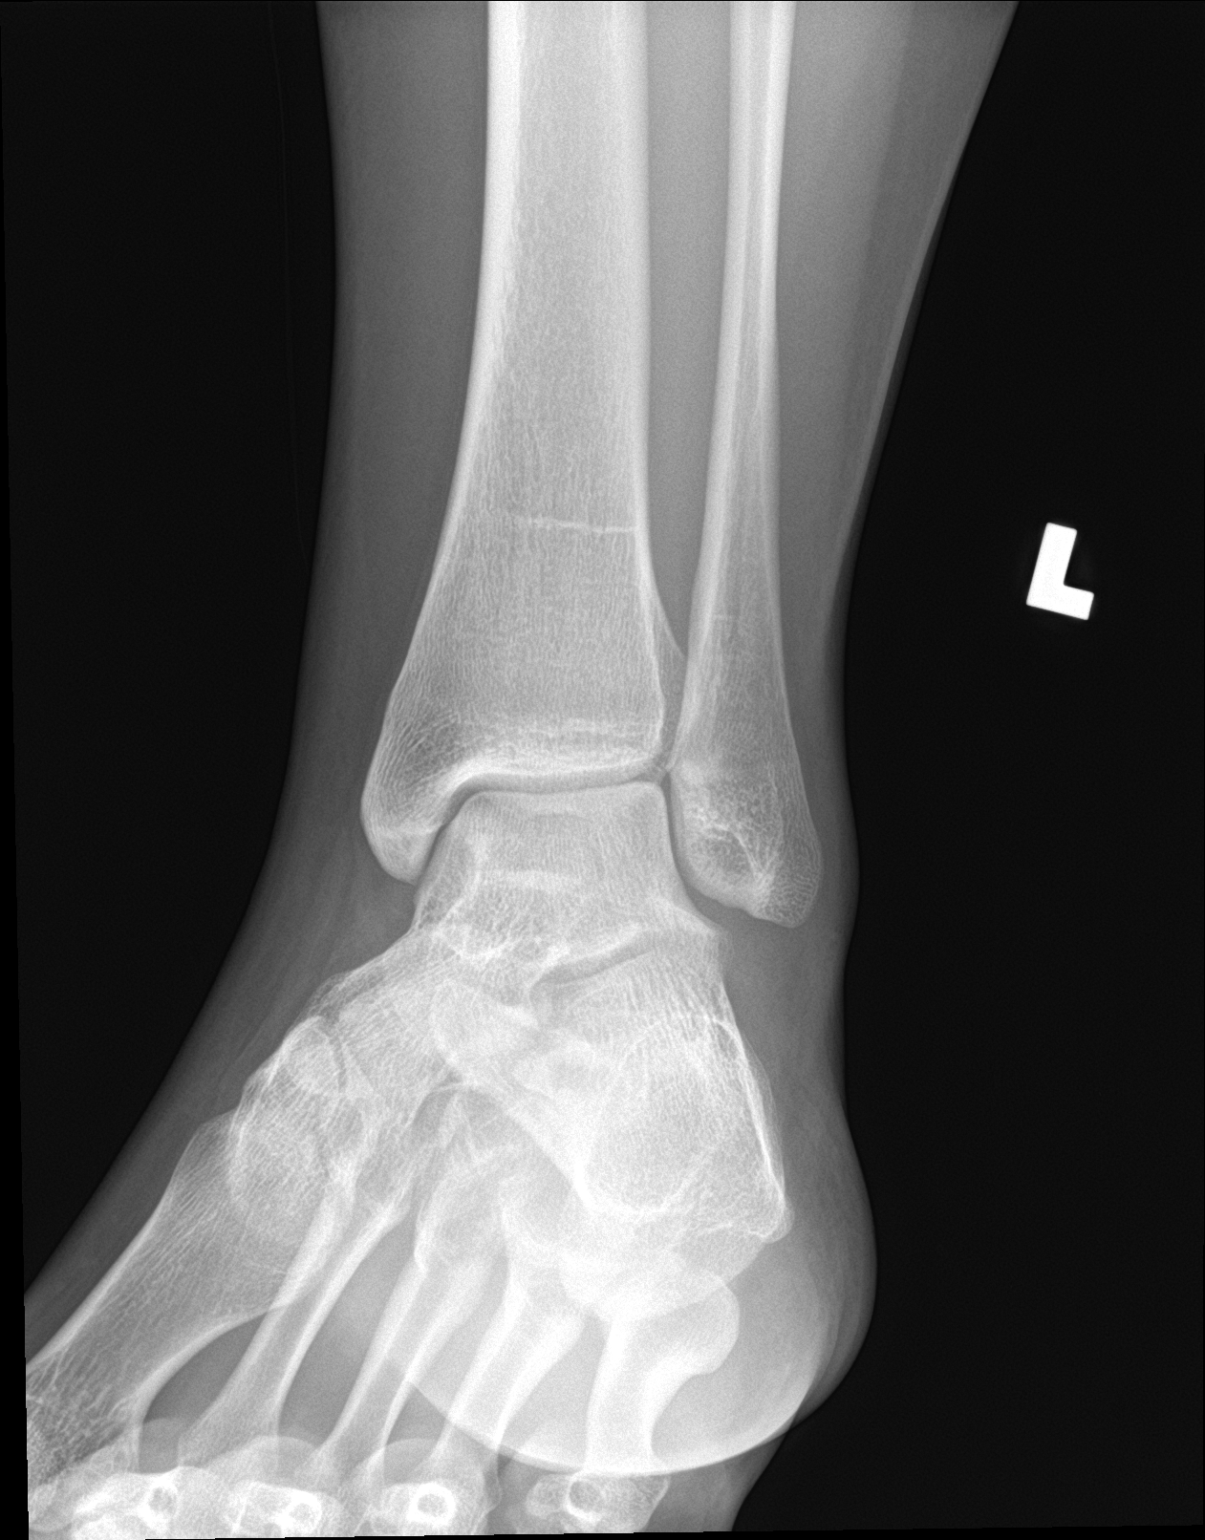

[ankle lat]
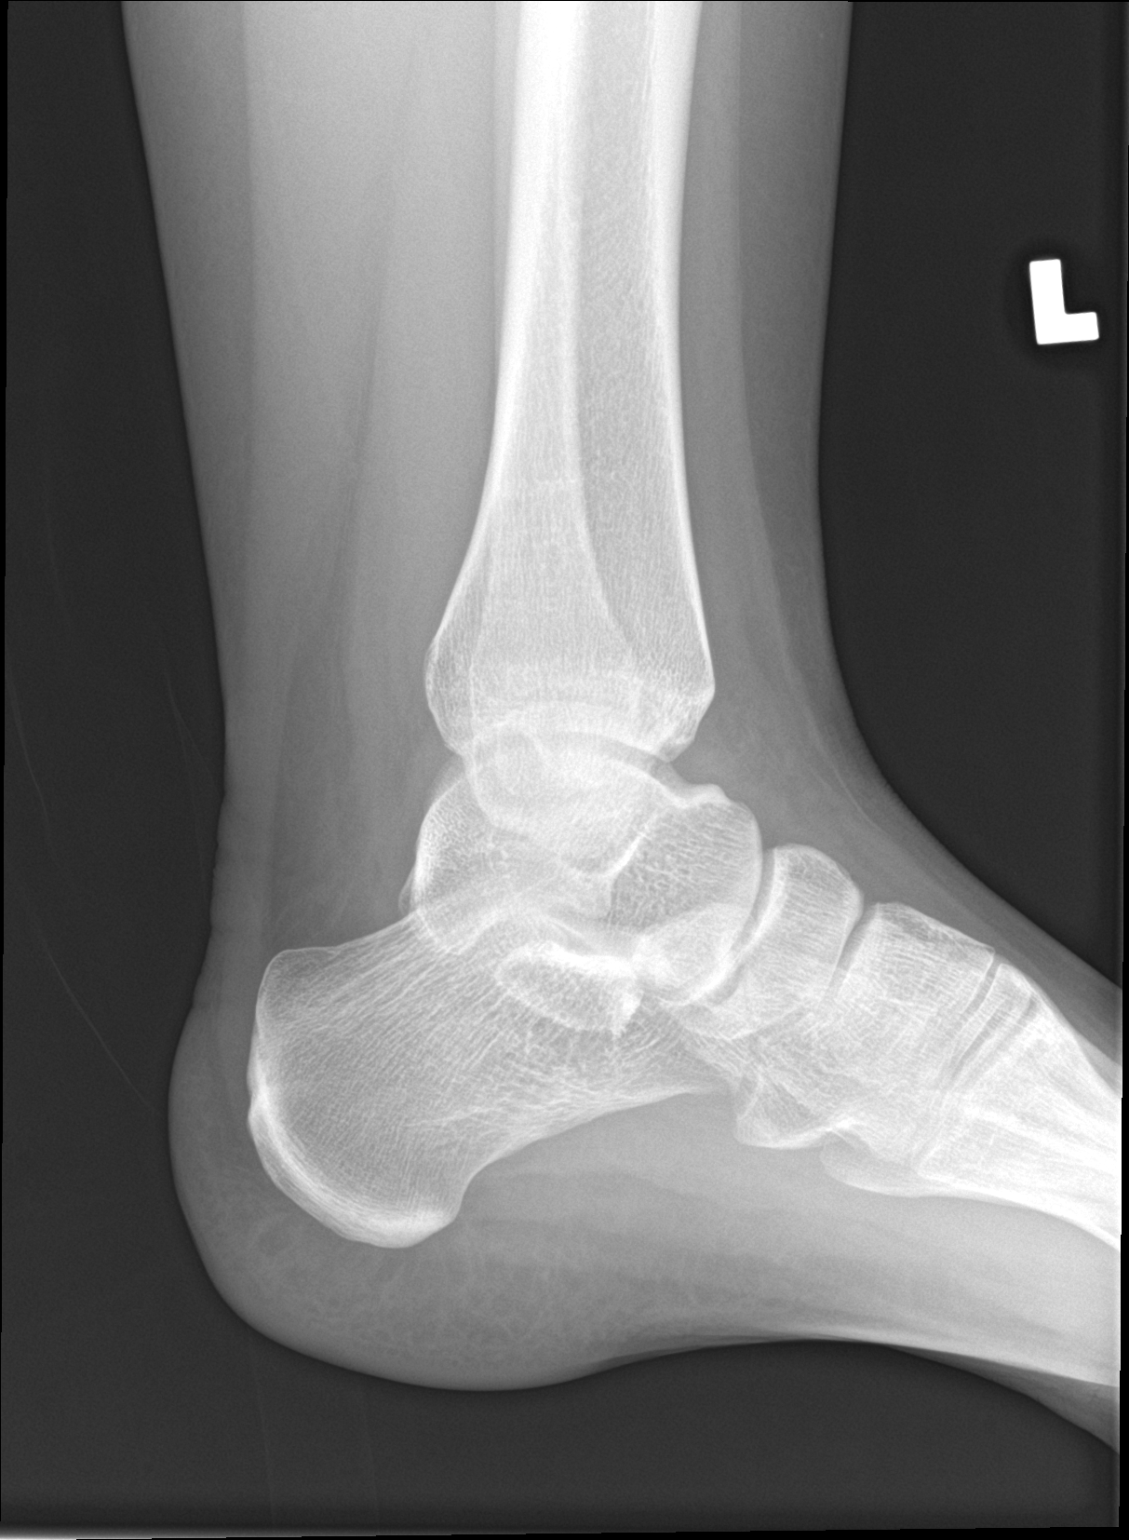

[3 of 3 positions shown; findings below may reference images not displayed]

FINDINGS: There is no evidence of fracture, dislocation, or joint effusion.
There is no evidence of arthropathy or other focal bone abnormality.
Mild soft tissue swelling laterally.
IMPRESSION: No acute osseous abnormality identified.
